# Patient Record
Sex: Male | Born: 1960 | Race: White | Hispanic: No | Marital: Married | Smoking: Former smoker
Health system: Southern US, Community
[De-identification: ages and names within clinical notes are randomized; demographics above are authoritative.]

## PROBLEM LIST (undated history)

## (undated) DIAGNOSIS — J302 Other seasonal allergic rhinitis: Secondary | ICD-10-CM

## (undated) HISTORY — PX: KNEE SURGERY: SHX244

## (undated) HISTORY — PX: ANKLE SURGERY: SHX546

## (undated) HISTORY — PX: SHOULDER SURGERY: SHX246

---

## 1997-11-05 ENCOUNTER — Ambulatory Visit (HOSPITAL_COMMUNITY): Admission: RE | Admit: 1997-11-05 | Discharge: 1997-11-05 | Payer: Self-pay | Admitting: Gastroenterology

## 2001-12-11 ENCOUNTER — Ambulatory Visit (HOSPITAL_COMMUNITY): Admission: RE | Admit: 2001-12-11 | Discharge: 2001-12-11 | Payer: Self-pay | Admitting: Gastroenterology

## 2002-08-15 ENCOUNTER — Encounter: Admission: RE | Admit: 2002-08-15 | Discharge: 2002-08-15 | Payer: Self-pay | Admitting: Family Medicine

## 2002-08-15 ENCOUNTER — Encounter: Payer: Self-pay | Admitting: Family Medicine

## 2003-08-17 ENCOUNTER — Ambulatory Visit (HOSPITAL_COMMUNITY): Admission: RE | Admit: 2003-08-17 | Discharge: 2003-08-17 | Payer: Self-pay | Admitting: Orthopaedic Surgery

## 2006-11-02 ENCOUNTER — Ambulatory Visit: Payer: Self-pay | Admitting: Family Medicine

## 2006-11-02 LAB — CONVERTED CEMR LAB
Cholesterol: 149 mg/dL (ref 0–200)
Direct LDL: 53.4 mg/dL
Glucose, Bld: 106 mg/dL — ABNORMAL HIGH (ref 70–99)
HDL: 27.1 mg/dL — ABNORMAL LOW (ref >39.0)
PSA: 1.55 ng/mL (ref 0.10–4.00)
Total CHOL/HDL Ratio: 5.5
Triglycerides: 453 mg/dL (ref 0–149)
VLDL: 91 mg/dL — ABNORMAL HIGH (ref 0–40)

## 2006-11-25 ENCOUNTER — Ambulatory Visit: Payer: Self-pay | Admitting: Family Medicine

## 2006-12-02 DIAGNOSIS — G43809 Other migraine, not intractable, without status migrainosus: Secondary | ICD-10-CM | POA: Insufficient documentation

## 2006-12-02 DIAGNOSIS — Z860100 Personal history of colon polyps, unspecified: Secondary | ICD-10-CM | POA: Insufficient documentation

## 2006-12-02 DIAGNOSIS — K625 Hemorrhage of anus and rectum: Secondary | ICD-10-CM

## 2006-12-06 ENCOUNTER — Ambulatory Visit: Payer: Self-pay | Admitting: Family Medicine

## 2007-02-02 ENCOUNTER — Telehealth (INDEPENDENT_AMBULATORY_CARE_PROVIDER_SITE_OTHER): Payer: Self-pay | Admitting: Family Medicine

## 2007-02-07 ENCOUNTER — Encounter (INDEPENDENT_AMBULATORY_CARE_PROVIDER_SITE_OTHER): Payer: Self-pay | Admitting: Family Medicine

## 2007-02-14 ENCOUNTER — Ambulatory Visit: Payer: Self-pay | Admitting: Family Medicine

## 2007-02-14 DIAGNOSIS — N453 Epididymo-orchitis: Secondary | ICD-10-CM | POA: Insufficient documentation

## 2007-02-17 ENCOUNTER — Encounter: Admission: RE | Admit: 2007-02-17 | Discharge: 2007-02-17 | Payer: Self-pay | Admitting: Family Medicine

## 2007-02-20 ENCOUNTER — Ambulatory Visit: Payer: Self-pay | Admitting: Family Medicine

## 2007-02-21 ENCOUNTER — Encounter (INDEPENDENT_AMBULATORY_CARE_PROVIDER_SITE_OTHER): Payer: Self-pay | Admitting: Family Medicine

## 2007-02-26 LAB — CONVERTED CEMR LAB
Cholesterol: 129 mg/dL (ref 0–200)
Glucose, Bld: 69 mg/dL — ABNORMAL LOW (ref 70–99)
HDL: 25.3 mg/dL — ABNORMAL LOW (ref >39.0)
LDL Cholesterol: 82 mg/dL (ref 0–99)
Total CHOL/HDL Ratio: 5.1
Triglycerides: 109 mg/dL (ref 0–149)
VLDL: 22 mg/dL (ref 0–40)

## 2007-02-27 ENCOUNTER — Telehealth (INDEPENDENT_AMBULATORY_CARE_PROVIDER_SITE_OTHER): Payer: Self-pay | Admitting: *Deleted

## 2007-03-21 ENCOUNTER — Ambulatory Visit: Payer: Self-pay | Admitting: Family Medicine

## 2007-03-21 DIAGNOSIS — R1013 Epigastric pain: Secondary | ICD-10-CM

## 2007-03-21 LAB — CONVERTED CEMR LAB
ALT: 22 units/L (ref 0–53)
AST: 20 units/L (ref 0–37)
Albumin: 4.4 g/dL (ref 3.5–5.2)
Alkaline Phosphatase: 97 units/L (ref 39–117)
Bilirubin, Direct: 0.1 mg/dL (ref 0.0–0.3)
H Pylori IgG: NEGATIVE
Total Bilirubin: 1.1 mg/dL (ref 0.3–1.2)
Total Protein: 7.2 g/dL (ref 6.0–8.3)

## 2007-03-22 ENCOUNTER — Telehealth (INDEPENDENT_AMBULATORY_CARE_PROVIDER_SITE_OTHER): Payer: Self-pay | Admitting: *Deleted

## 2007-03-22 ENCOUNTER — Encounter (INDEPENDENT_AMBULATORY_CARE_PROVIDER_SITE_OTHER): Payer: Self-pay | Admitting: Family Medicine

## 2007-03-23 ENCOUNTER — Telehealth (INDEPENDENT_AMBULATORY_CARE_PROVIDER_SITE_OTHER): Payer: Self-pay | Admitting: *Deleted

## 2007-03-24 ENCOUNTER — Telehealth (INDEPENDENT_AMBULATORY_CARE_PROVIDER_SITE_OTHER): Payer: Self-pay | Admitting: *Deleted

## 2007-04-14 ENCOUNTER — Ambulatory Visit: Payer: Self-pay | Admitting: Family Medicine

## 2007-05-17 ENCOUNTER — Telehealth (INDEPENDENT_AMBULATORY_CARE_PROVIDER_SITE_OTHER): Payer: Self-pay | Admitting: *Deleted

## 2007-05-19 ENCOUNTER — Ambulatory Visit: Payer: Self-pay | Admitting: Family Medicine

## 2007-05-19 DIAGNOSIS — R229 Localized swelling, mass and lump, unspecified: Secondary | ICD-10-CM

## 2007-06-07 ENCOUNTER — Encounter (INDEPENDENT_AMBULATORY_CARE_PROVIDER_SITE_OTHER): Payer: Self-pay | Admitting: Family Medicine

## 2007-06-14 ENCOUNTER — Telehealth (INDEPENDENT_AMBULATORY_CARE_PROVIDER_SITE_OTHER): Payer: Self-pay | Admitting: *Deleted

## 2007-06-19 ENCOUNTER — Ambulatory Visit: Payer: Self-pay | Admitting: Family Medicine

## 2007-06-19 DIAGNOSIS — M549 Dorsalgia, unspecified: Secondary | ICD-10-CM | POA: Insufficient documentation

## 2007-08-09 ENCOUNTER — Encounter (INDEPENDENT_AMBULATORY_CARE_PROVIDER_SITE_OTHER): Payer: Self-pay | Admitting: Family Medicine

## 2007-08-21 ENCOUNTER — Telehealth (INDEPENDENT_AMBULATORY_CARE_PROVIDER_SITE_OTHER): Payer: Self-pay | Admitting: *Deleted

## 2007-11-08 ENCOUNTER — Emergency Department (HOSPITAL_COMMUNITY): Admission: EM | Admit: 2007-11-08 | Discharge: 2007-11-08 | Payer: Self-pay | Admitting: Emergency Medicine

## 2010-08-22 ENCOUNTER — Encounter: Payer: Self-pay | Admitting: Orthopaedic Surgery

## 2010-12-18 NOTE — Op Note (Signed)
[place]  Patient:    Jack Cooper, Jack Cooper Visit Number: [id number] MRN: [id number]          Service Type: END Location: ENDO Attending Physician:  Nelda Marseille Dictated by:   Petra Kuba, M.D. Proc. Date: 12/11/01 Admit Date:  12/11/2001   CC:         [facility]   Operative Report  PROCEDURE:  Colonoscopy.  INDICATION:  Patient with a history of colon polyps at a young age.  Due for repeat screening.  Consent was signed after risks, benefits, methods, and options thoroughly discussed in the past.  MEDICATIONS:  Demerol 100 mg, Versed 10 mg.  DESCRIPTION OF PROCEDURE:  Rectal inspection was pertinent for external hemorrhoids, small.  Digital exam was negative.  Video colonoscope was inserted, easily advanced around the colon to the cecum.  This did require abdominal pressure but no position changes and on insertion, a tiny probable hyperplastic-appearing rectal polyp was seen but not biopsied.  The cecum was identified by the appendiceal orifice and the ileocecal valve.  In fact, the scope was inserted a short way in the terminal ileum, which was normal.  The scope was then slowly withdrawn.  On slow withdrawal through the colon, no abnormalities were seen.  The prep was fairly adequate.  He had lots of bubbles, which required lots of washing and suctioning with Mylicon wash, but no other abnormality was seen as we slowly withdrew back to the rectum.  Once back in the rectum we looked for the polyp seen on insertion, which was hyperplastic-appearing.  It had been photo documented without any worrisome stigmata, but could not find it on withdrawal.  We went ahead and retroflexed, pertinent for some internal hemorrhoids.  The scope was straightened and readvanced a short way up the left side of the colon, air was suctioned, and scope removed.  The patient tolerated the procedure well.  There was no obvious  immediate complication.  ENDOSCOPIC DIAGNOSES: 1. Internal-external hemorrhoids. 2. Questionable tiny hyperplastic rectal polyp seen on insertion, not on    withdrawal. 3. Increased bubbles with lots of washing. 4. Otherwise within normal limits to the terminal ileum.  PLAN:  Yearly rectals and guaiacs per primary care.  GI follow-up p.r.n. Otherwise repeat screening in five years, as needed p.r.n. Dictated by:   Petra Kuba, M.D. Attending Physician:  Nelda Marseille DD:  12/11/01 TD:  12/12/01 Job: [phone] [id number]

## 2011-04-27 LAB — POCT I-STAT, CHEM 8
BUN: 15
Calcium, Ion: 1.23
Creatinine, Ser: 1.3
Glucose, Bld: 94
TCO2: 30

## 2011-04-27 LAB — POCT CARDIAC MARKERS
Myoglobin, poc: 39.7
Operator id: 285841
Troponin i, poc: <0.05

## 2012-12-30 ENCOUNTER — Emergency Department (HOSPITAL_COMMUNITY)
Admission: EM | Admit: 2012-12-30 | Discharge: 2012-12-30 | Disposition: A | Discharge status: Home / Self Care | Payer: BC Managed Care – PPO | Attending: Emergency Medicine | Admitting: Emergency Medicine

## 2012-12-30 ENCOUNTER — Emergency Department (HOSPITAL_COMMUNITY): Payer: BC Managed Care – PPO

## 2012-12-30 ENCOUNTER — Encounter (HOSPITAL_COMMUNITY): Payer: Self-pay | Admitting: Emergency Medicine

## 2012-12-30 DIAGNOSIS — Z79899 Other long term (current) drug therapy: Secondary | ICD-10-CM | POA: Insufficient documentation

## 2012-12-30 DIAGNOSIS — Z87891 Personal history of nicotine dependence: Secondary | ICD-10-CM | POA: Insufficient documentation

## 2012-12-30 DIAGNOSIS — J309 Allergic rhinitis, unspecified: Secondary | ICD-10-CM | POA: Insufficient documentation

## 2012-12-30 DIAGNOSIS — K219 Gastro-esophageal reflux disease without esophagitis: Secondary | ICD-10-CM | POA: Insufficient documentation

## 2012-12-30 HISTORY — DX: Other seasonal allergic rhinitis: J30.2

## 2012-12-30 LAB — CBC
Hemoglobin: 15.2 g/dL (ref 13.0–17.0)
MCH: 30.2 pg (ref 26.0–34.0)
Platelets: 163 10*3/uL (ref 150–400)
RBC: 5.04 MIL/uL (ref 4.22–5.81)

## 2012-12-30 LAB — BASIC METABOLIC PANEL
CO2: 28 mEq/L (ref 19–32)
Calcium: 9.8 mg/dL (ref 8.4–10.5)
GFR calc non Af Amer: >90 mL/min (ref >90)
Potassium: 4.2 mEq/L (ref 3.5–5.1)
Sodium: 141 mEq/L (ref 135–145)

## 2012-12-30 LAB — TROPONIN I: Troponin I: <0.3 ng/mL (ref <0.30)

## 2012-12-30 LAB — POCT I-STAT TROPONIN I: Troponin i, poc: 0 ng/mL (ref 0.00–0.08)

## 2012-12-30 MED ORDER — GI COCKTAIL ~~LOC~~
30.0000 mL | Freq: Once | ORAL | Status: AC
  Administered 2012-12-30: 30 mL via ORAL
  Filled 2012-12-30: qty 30

## 2012-12-30 MED ORDER — ASPIRIN 81 MG PO CHEW
324.0000 mg | CHEWABLE_TABLET | Freq: Once | ORAL | Status: AC
  Administered 2012-12-30: 324 mg via ORAL
  Filled 2012-12-30: qty 4

## 2012-12-30 MED ORDER — FAMOTIDINE 20 MG PO TABS: 20.0000 mg | ORAL_TABLET | Freq: Two times a day (BID) | ORAL | Status: AC

## 2012-12-30 NOTE — ED Provider Notes (Signed)
History     CSN: [id number]  Arrival date & time 12/30/12  0125   First MD Initiated Contact with Patient 12/30/12 0245      Chief Complaint  Patient presents with  . Chest Pain    (Consider location/radiation/quality/duration/timing/severity/associated sxs/prior treatment) HPI  Patient to the ED with complaints of left sided chest pain that is like a dull ache. It has been hurting since Thursday and has never been more severe than a 2/10 pain. It worsens when he lays flat and relieves when he sits up. Typically its worse during bed time. He does admit eating fairly close to bed time but denies a hx of GERD. He says he is also very active and may have pulled a muscle. " i dont think its my heart but wanted to be sure". NO associated symptoms of sob, nausea, vomiting, diarrhea, diaphoresis, confusion. He is otherwise healthy aside from surgeries on his ankle, knee and shoulder. He currently is having a " < 1/10" pain. nad vss  Past Medical History  Diagnosis Date  . Seasonal allergies     Past Surgical History  Procedure Laterality Date  . Knee surgery    . Shoulder surgery    . Ankle surgery      No family history on file.  History  Substance Use Topics  . Smoking status: Former Games developer  . Smokeless tobacco: Not on file  . Alcohol Use: Yes      Review of Systems  Cardiovascular: Positive for chest pain.  All other systems reviewed and are negative.    Allergies  Review of patient's allergies indicates no known allergies.  Home Medications   Current Outpatient Rx  Name  Route  Sig  Dispense  Refill  . cetirizine (ZYRTEC) 10 MG tablet   Oral   Take 10 mg by mouth daily as needed for allergies.         . fluticasone (FLONASE) 50 MCG/ACT nasal spray   Nasal   Place 2 sprays into the nose daily as needed (nasal congestion).         . famotidine (PEPCID) 20 MG tablet   Oral   Take 1 tablet (20 mg total) by mouth 2 (two) times daily.   30 tablet   0      BP 123/75  Pulse 52  Temp(Src) 98.2 F (36.8 C) (Oral)  Resp 17  Ht 5\' 10"  (1.778 m)  Wt 180 lb (81.647 kg)  BMI 25.83 kg/m2  SpO2 98%  Physical Exam  Nursing note and vitals reviewed. Constitutional: He appears well-developed and well-nourished. No distress.  HENT:  Head: Normocephalic and atraumatic.  Eyes: Pupils are equal, round, and reactive to light.  Neck: Normal range of motion. Neck supple.  Cardiovascular: Normal rate, regular rhythm and normal heart sounds.   Pulmonary/Chest: Effort normal. No respiratory distress. He has no wheezes. He has no rales. He exhibits no tenderness.  Abdominal: Soft.  Neurological: He is alert.  Skin: Skin is warm and dry.    ED Course  Procedures (including critical care time)  Labs Reviewed  CBC  BASIC METABOLIC PANEL  TROPONIN I  POCT I-STAT TROPONIN I   Dg Chest 2 View  12/30/2012   *RADIOLOGY REPORT*  Clinical Data: Chest pain  CHEST - 2 VIEW  Comparison: 11/08/2007  Findings: Mild aortic tortuosity and atherosclerosis.  Normal heart size.  No pleural effusion or pneumothorax.  No confluent airspace opacity.  Nodular opacity projecting over the left upper lung  is favored to be a external such as radiolucent EKG lead. No acute osseous finding.  IMPRESSION: Nodular opacity projecting over the left lung apex is favored to be external such as a radiolucent EKG and can be confirmed at follow- up with the removal of the overlying support devices when the patient is able.   Original Report Authenticated By: Jearld Lesch, M.D.     1. GERD (gastroesophageal reflux disease)       MDM  Patients symptoms most consistent with GERD. GI cocktail and aspirin ordered.  Labs pending @ 3:44am  4:45am- labs are unremarkable and pain has been constant since Thursday morning. Given a GI cocktail and then laid flat and his symptoms were no longer exacerbated He says he feels great and would like to go.  Discussed patient with Dr. Norlene Campbell.  Rx Pecid.  Pt has been advised of the symptoms that warrant their return to the ED. Patient has voiced understanding and has agreed to follow-up with the PCP or specialist.         Dorthula Matas, PA-C 12/30/12 [phone]

## 2012-12-30 NOTE — ED Notes (Signed)
The patient is AOx4 and comfortable with his discharge instructions. 

## 2012-12-30 NOTE — ED Provider Notes (Signed)
Medical screening examination/treatment/procedure(s) were performed by non-physician practitioner and as supervising physician I was immediately available for consultation/collaboration.  Olivia Mackie, MD 12/30/12 [phone]

## 2012-12-30 NOTE — ED Notes (Signed)
Reports dull L sided chest pain that started while lying in bed on Thursday.  Denies sob, nausea, vomiting, and diaphoresis.  C/o very mild back pain.

## 2019-05-04 ENCOUNTER — Other Ambulatory Visit: Payer: Self-pay

## 2019-05-04 DIAGNOSIS — Z20822 Contact with and (suspected) exposure to covid-19: Secondary | ICD-10-CM

## 2019-05-05 ENCOUNTER — Encounter: Payer: Self-pay | Admitting: Family Medicine

## 2019-05-05 LAB — NOVEL CORONAVIRUS, NAA: SARS-CoV-2, NAA: DETECTED — AB

## 2019-05-07 NOTE — Telephone Encounter (Signed)
PEC informed patient of results and the steps following dx.

## 2019-05-07 NOTE — Telephone Encounter (Signed)
The pec ordered it He needs to self quarantine for 14 days

## 2019-05-07 NOTE — Telephone Encounter (Signed)
He was Positive They were not sent to me -- so I did not see them until he sent his message

## 2021-04-24 ENCOUNTER — Other Ambulatory Visit: Payer: Self-pay

## 2021-04-24 ENCOUNTER — Emergency Department (HOSPITAL_COMMUNITY): Payer: BC Managed Care – PPO

## 2021-04-24 ENCOUNTER — Emergency Department (HOSPITAL_COMMUNITY)
Admission: EM | Admit: 2021-04-24 | Discharge: 2021-04-24 | Disposition: A | Discharge status: Home / Self Care | Payer: BC Managed Care – PPO | Attending: Emergency Medicine | Admitting: Emergency Medicine

## 2021-04-24 DIAGNOSIS — S060X1A Concussion with loss of consciousness of 30 minutes or less, initial encounter: Secondary | ICD-10-CM | POA: Diagnosis not present

## 2021-04-24 DIAGNOSIS — S6292XA Unspecified fracture of left wrist and hand, initial encounter for closed fracture: Secondary | ICD-10-CM

## 2021-04-24 DIAGNOSIS — Y92410 Unspecified street and highway as the place of occurrence of the external cause: Secondary | ICD-10-CM | POA: Diagnosis not present

## 2021-04-24 DIAGNOSIS — Z87891 Personal history of nicotine dependence: Secondary | ICD-10-CM | POA: Diagnosis not present

## 2021-04-24 DIAGNOSIS — M79642 Pain in left hand: Secondary | ICD-10-CM | POA: Insufficient documentation

## 2021-04-24 DIAGNOSIS — S0990XA Unspecified injury of head, initial encounter: Secondary | ICD-10-CM | POA: Diagnosis present
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
